# Patient Record
Sex: Female | Born: 2005 | Race: White | Hispanic: No | Marital: Single | Smoking: Never smoker
Health system: Southern US, Community
[De-identification: ages and names within clinical notes are randomized; demographics above are authoritative.]

## PROBLEM LIST (undated history)

## (undated) DIAGNOSIS — F32A Depression, unspecified: Secondary | ICD-10-CM

## (undated) DIAGNOSIS — F419 Anxiety disorder, unspecified: Secondary | ICD-10-CM

## (undated) HISTORY — DX: Depression, unspecified: F32.A

## (undated) HISTORY — PX: URETHRA SURGERY: SHX824

## (undated) HISTORY — DX: Anxiety disorder, unspecified: F41.9

---
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
## 2023-02-08 ENCOUNTER — Ambulatory Visit (INDEPENDENT_AMBULATORY_CARE_PROVIDER_SITE_OTHER): Payer: BC Managed Care – PPO | Admitting: Nurse Practitioner

## 2023-02-08 VITALS — BP 120/76 | HR 77 | Wt 153.0 lb

## 2023-02-08 DIAGNOSIS — Z3009 Encounter for other general counseling and advice on contraception: Secondary | ICD-10-CM

## 2023-02-08 DIAGNOSIS — Z30015 Encounter for initial prescription of vaginal ring hormonal contraceptive: Secondary | ICD-10-CM

## 2023-02-08 MED ORDER — ETONOGESTREL-ETHINYL ESTRADIOL 0.12-0.015 MG/24HR VA RING: 1.0000 | VAGINAL_RING | VAGINAL | 3 refills | Status: DC

## 2023-02-08 NOTE — Progress Notes (Signed)
   Acute Office Visit  Subjective:    Patient ID: Evelyn Perez, female    DOB: 06/21/2006, 17 y.o.   MRN: [id number]   HPI 17 y.o. presents today to discuss birth control. Nexplanon removed 11/2022 due to frequent bleeding. At that time she was not interested in contraception. Considering Nuvaring. Used Nuvaring with Nexplanon at one point for management of frequent bleeding and did well. Felt she gained weight with COCs. She was in [place] a couple of weeks ago and experienced severe left sided pain. Pain resolved but does feel pain can be intermittent, mainly with certain movements.   Patient's last menstrual period was 01/22/2023.    Review of Systems  Constitutional: Negative.   Gastrointestinal:  Positive for abdominal pain (LLQ, resolved).  Genitourinary: Negative.        Objective:    Physical Exam Constitutional:      Appearance: Normal appearance.   GU: Not indicated  BP 120/76   Pulse 77   Wt 153 lb (69.4 kg)   LMP 01/22/2023   SpO2 100%   BMI 23.96 kg/m  Wt Readings from Last 3 Encounters:  02/08/23 153 lb (69.4 kg) (83%, Z= 0.97)*  11/24/21 145 lb (65.8 kg) (80%, Z= 0.84)*  10/27/21 147 lb (66.7 kg) (82%, Z= 0.91)*   * Growth percentiles are based on CDC (Girls, 2-20 Years) data.        Assessment & Plan:   Problem List Items Addressed This Visit   None Visit Diagnoses     General counseling and advice on female contraception    -  Primary   Encounter for initial prescription of vaginal ring hormonal contraceptive       Relevant Medications   etonogestrel-ethinyl estradiol (NUVARING) 0.12-0.015 MG/24HR vaginal ring      Plan: Contraceptive options were reviewed, including hormonal methods, both combination (pill, patch, vaginal ring) and progesterone-only (pill, Depo Provera and Nexplanon), intrauterine devices (Mirena, [facility] [place], and [place]), Phexxi, barrier methods (condoms, diaphragm) and female/female sterilization. The mechanisms, risks,  benefits and side effects of all methods were discussed. Would like to start Nuvaring. Will start with first day of next period. LLQ pain has resolved but reports can be intermittent. Discussed possibility of ovarian cyst versus musculoskeletal. Will monitor. If returns can do pelvic ultrasound.      [name] DNP, 11:25 AM 02/08/2023

## 2023-02-23 ENCOUNTER — Other Ambulatory Visit: Payer: Self-pay | Admitting: Radiology

## 2023-02-23 DIAGNOSIS — B3731 Acute candidiasis of vulva and vagina: Secondary | ICD-10-CM

## 2023-02-23 NOTE — Telephone Encounter (Signed)
Medication refill request: terconazole 0.8% vaginal cream Last OV:  11-05-22 Next AEX: not scheduled Last MMG (if hormonal medication request): n/a Refill authorized: this rx denied, with the reason being for patient to contact the office

## 2023-04-19 NOTE — Telephone Encounter (Signed)
Note opened in error.

## 2023-09-09 ENCOUNTER — Ambulatory Visit: Payer: BC Managed Care – PPO | Admitting: Nurse Practitioner

## 2023-09-22 ENCOUNTER — Ambulatory Visit (INDEPENDENT_AMBULATORY_CARE_PROVIDER_SITE_OTHER): Payer: Self-pay | Admitting: Nurse Practitioner

## 2023-09-22 ENCOUNTER — Encounter: Payer: Self-pay | Admitting: Nurse Practitioner

## 2023-09-22 VITALS — BP 94/52 | HR 78 | Resp 14 | Ht 66.5 in | Wt 155.0 lb

## 2023-09-22 DIAGNOSIS — Z0000 Encounter for general adult medical examination without abnormal findings: Secondary | ICD-10-CM

## 2023-09-22 DIAGNOSIS — Z3009 Encounter for other general counseling and advice on contraception: Secondary | ICD-10-CM

## 2023-09-22 DIAGNOSIS — Z1331 Encounter for screening for depression: Secondary | ICD-10-CM | POA: Diagnosis not present

## 2023-09-22 DIAGNOSIS — Z01419 Encounter for gynecological examination (general) (routine) without abnormal findings: Secondary | ICD-10-CM | POA: Diagnosis not present

## 2023-09-22 NOTE — Progress Notes (Signed)
   Evelyn Perez [birth year] [id number]   History:  18 y.o. G0 presents for annual exam. No GYN complaints. Monthly cycles. Interested in getting Nexplanon again. Did Nuvaring but wants something she does not have to keep up with. Completed Gardasil series.   Gynecologic History Patient's last menstrual period was 09/14/2023. Period Cycle (Days): 28 Period Duration (Days): 5-6 Period Pattern: Regular Menstrual Flow: Heavy Menstrual Control: Maxi pad, Tampon Menstrual Control Change Freq (Hours): changes pad/tampon every 3 hours Dysmenorrhea: (!) Severe (back pain) Contraception/Family planning: coitus interruptus Sexually active: Yes  Health Maintenance Last Pap: Not indicated Last mammogram: Not indicated Last colonoscopy: Not indicated Last Dexa: Not indicated     09/22/2023    2:49 PM  Depression screen PHQ 2/9  Decreased Interest 0  Down, Depressed, Hopeless 0  PHQ - 2 Score 0     Past medical history, past surgical history, family history and social history were all reviewed and documented in the EPIC chart. At [facility]. Thinking psych or business. Boyfriend.   ROS:  A ROS was performed and pertinent positives and negatives are included.  Exam:  Vitals:   09/22/23 1444  BP: (!) 94/52  Pulse: 78  Resp: 14  Weight: 155 lb (70.3 kg)  Height: 5' 6.5" (1.689 m)   Body mass index is 24.64 kg/m.   General appearance:  Normal Thyroid:  Symmetrical, normal in size, without palpable masses or nodularity. Respiratory  Auscultation:  Clear without wheezing or rhonchi Cardiovascular  Auscultation:  Regular rate, without rubs, murmurs or gallops  Edema/varicosities:  Not grossly evident Abdominal  Soft,nontender, without masses, guarding or rebound.  Liver/spleen:  No organomegaly noted  Hernia:  None appreciated  Skin  Inspection:  Grossly normal Breasts: Not indicated per guidelines Pelvic: Not indicated  Assessment/Plan:  18 y.o. G0 for annual exam.   Well  female exam without gynecological exam - Education provided on SBEs, importance of preventative screenings, current guidelines, high calcium diet, regular exercise, and multivitamin daily.   General counseling and advice on female contraception - Plan: Insertion of implanon rod.  Return in about 1 year (around 09/21/2024) for Annual.   [name] DNP, 3:07 PM 09/22/2023

## 2023-10-18 ENCOUNTER — Encounter: Payer: Self-pay | Admitting: Nurse Practitioner

## 2023-10-18 ENCOUNTER — Ambulatory Visit: Admitting: Nurse Practitioner

## 2023-10-18 VITALS — BP 118/62 | HR 89 | Ht 68.0 in | Wt 154.0 lb

## 2023-10-18 DIAGNOSIS — Z30017 Encounter for initial prescription of implantable subdermal contraceptive: Secondary | ICD-10-CM | POA: Diagnosis not present

## 2023-10-18 DIAGNOSIS — Z01812 Encounter for preprocedural laboratory examination: Secondary | ICD-10-CM

## 2023-10-18 LAB — PREGNANCY, URINE: Preg Test, Ur: NEGATIVE

## 2023-10-18 MED ORDER — ETONOGESTREL 68 MG ~~LOC~~ IMPL
68.0000 mg | DRUG_IMPLANT | Freq: Once | SUBCUTANEOUS | Status: AC
Start: 2023-10-18 — End: 2023-10-18
  Administered 2023-10-18: 68 mg via SUBCUTANEOUS

## 2023-10-18 NOTE — Progress Notes (Signed)
 18 y.o. G0P0000 female presents for Nexplanon insertion.  She has been counseled about alternative types of contraception and has decided this long acting method is the best for her.  Procedure, risks and benefits have all been explained.  She has the following questions today:  None. Prefers right arm due to tattoos.   LMP:  10/14/2023   After all questions were answered, consent was obtained.    Past Medical History:  Diagnosis Date   Anxiety    Depression     Past Surgical History:  Procedure Laterality Date   URETHRA SURGERY      Current Outpatient Medications on File Prior to Visit  Medication Sig Dispense Refill   escitalopram (LEXAPRO) 20 MG tablet Take 20 mg by mouth daily.     ferrous sulfate 325 (65 FE) MG tablet Take by mouth.     No current facility-administered medications on file prior to visit.   Allergies  Allergen Reactions   Shellfish Allergy Shortness Of Breath   Latex Itching and Rash    Vitals:   10/18/23 0936  BP: 118/62  Pulse: 89  SpO2: 98%   Physical Exam  Procedure: Patient placed supine on exam table with her right arm flexed at the elbow. The location for insertion site was identified 8-10 cm from epicondyle and 3-5 cm posterior.  Area cleansed with Betadine x 3.  Insertion site and path of insertion was anesthetized with 1% Lidocaine without epinephrine, 1.5 cc total.  Using Nexplanon device (and after confirming presence of rod in device), skin was pierced and then elevated along insertion path, passing device just under the skin.  Rod released and device inserted.  Rod palpated easily.  Steri-strips applied and a pressure bandage was place over the site.  Entire procedure performed with sterile technique.  Assessment: Nexplanon insertion.  UPT neg  Plan:  Post procedure instructions reviewed with pt.  Questions answered.  Pt knows to call with any concerns or questions.  Pt is aware removal is due by 3 calendar years from today.

## 2024-01-10 ENCOUNTER — Ambulatory Visit
Admission: EM | Admit: 2024-01-10 | Discharge: 2024-01-10 | Disposition: A | Discharge status: Home / Self Care | Attending: Family Medicine | Admitting: Family Medicine

## 2024-01-10 DIAGNOSIS — R5383 Other fatigue: Secondary | ICD-10-CM | POA: Diagnosis not present

## 2024-01-10 LAB — POCT MONO SCREEN (KUC): Mono, POC: NEGATIVE

## 2024-01-10 NOTE — ED Triage Notes (Signed)
 Pt presents to [facility] with co fatigue, and weakness for 2 weeks. 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 Pt reports nausea body aches and pain since yesterday. Pt reports motrin  otc.

## 2024-01-10 NOTE — ED Provider Notes (Signed)
 Evelyn Perez    CSN: [id number] Arrival date & time: 01/10/24  1039      History   Chief Complaint Chief Complaint  Patient presents with   Fatigue    Concern for mono    HPI Evelyn Perez is a 18 y.o. female.   HPI 18 year old female presents with fatigue and weakness for 2 weeks.  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  Reports nausea, body aches and pains since yesterday.  PMH significant for ADHD and asthma, intermittent.  Epic chart reveals patient followed by oncology on 01/21/2023 for iron deficient anemia and chronic fatigue.  Past Medical History:  Diagnosis Date   Anxiety    Depression     Patient Active Problem List   Diagnosis Date Noted   Nondisplaced Salter-Harris type I physeal fracture of distal end of left radius 02/01/2019   Nondisplaced fracture of lateral malleolus of right fibula, initial encounter for closed fracture 05/26/2018   ADHD (attention deficit hyperactivity disorder), combined type 08/04/2017   Emotional disturbance of childhood 08/04/2017   Patellofemoral pain syndrome of right knee 02/24/2017   Asthma, intermittent 12/24/2015    Past Surgical History:  Procedure Laterality Date   URETHRA SURGERY      OB History     Gravida  0   Para  0   Term  0   Preterm  0   AB  0   Living  0      SAB  0   IAB  0   Ectopic  0   Multiple  0   Live Births  0            Home Medications    Prior to Admission medications   Medication Sig Start Date End Date Taking? Authorizing Provider  escitalopram (LEXAPRO) 20 MG tablet Take 20 mg by mouth daily.    [provider]  ferrous sulfate 325 (65 FE) MG tablet Take by mouth. 01/30/22 09/28/23  [provider]    Family History Family History  Problem Relation Age of Onset   Anxiety disorder Mother    Ovarian cancer Paternal Grandmother     Social History Social History   Tobacco Use   Smoking status:  Never    Passive exposure: Never   Smokeless tobacco: Never  Vaping Use   Vaping status: Never Used  Substance Use Topics   Alcohol use: Never   Drug use: Yes    Frequency: 3.0 times per week    Types: Marijuana     Allergies   Shellfish allergy and Latex   Review of Systems Review of Systems  Constitutional:  Positive for fatigue.  All other systems reviewed and are negative.    Physical Exam Triage Vital Signs ED Triage Vitals  Encounter Vitals Group     BP      Girls Systolic BP Percentile      Girls Diastolic BP Percentile      Boys Systolic BP Percentile      Boys Diastolic BP Percentile      Pulse      Resp      Temp      Temp src      SpO2      Weight      Height      Head Circumference      Peak Flow      Pain Score      Pain Loc  Pain Education      Exclude from Growth Chart    No data found.  Updated Vital Signs BP 114/73   Pulse 82   Temp 98.1 F (36.7 C)   Resp 19   LMP 12/21/2023 (Approximate)   SpO2 98%     Physical Exam Vitals and nursing note reviewed.  Constitutional:      Appearance: Normal appearance. She is normal weight.  HENT:     Head: Normocephalic and atraumatic.     Right Ear: Tympanic membrane, ear canal and external ear normal.     Left Ear: Tympanic membrane, ear canal and external ear normal.     Mouth/Throat:     Mouth: Mucous membranes are moist.     Pharynx: Oropharynx is clear.  Eyes:     Extraocular Movements: Extraocular movements intact.     Conjunctiva/sclera: Conjunctivae normal.     Pupils: Pupils are equal, round, and reactive to light.  Cardiovascular:     Rate and Rhythm: Normal rate and regular rhythm.     Pulses: Normal pulses.     Heart sounds: Normal heart sounds.  Pulmonary:     Effort: Pulmonary effort is normal.     Breath sounds: Normal breath sounds. No wheezing, rhonchi or rales.  Musculoskeletal:        General: Normal range of motion.     Cervical back: Normal range of motion  and neck supple.  Skin:    General: Skin is warm and dry.  Neurological:     General: No focal deficit present.     Mental Status: She is alert and oriented to person, place, and time. Mental status is at baseline.  Psychiatric:        Mood and Affect: Mood normal.        Behavior: Behavior normal.      UC Treatments / Results  Labs (all labs ordered are listed, but only abnormal results are displayed) Labs Reviewed  POCT MONO SCREEN [facility]) - Normal    EKG   Radiology No results found.  Procedures Procedures (including critical Perez time)  Medications Ordered in UC Medications - No data to display  Initial Impression / Assessment and Plan / UC Course  I have reviewed the triage vital signs and the nursing notes.  Pertinent labs & imaging results that were available during my Perez of the patient were reviewed by me and considered in my medical decision making (see chart for details).     MDM: 1.  Fatigue, unspecified type-patient has history of chronic fatigue and iron deficiency anemia.  Advised patient to follow-up with PCP or establish with new [place] primary Perez to have baseline labs drawn. Advised patient if symptoms worsen and/or unresolved please follow-up with your PCP or here for further evaluation.  [place] primary Perez contact information provided with his AVS today.  Work note provided to patient per request prior to discharge.  Patient discharged home, hemodynamically stable. Final Clinical Impressions(s) / UC Diagnoses   Final diagnoses:  Fatigue, unspecified type     Discharge Instructions      Advised patient if symptoms worsen and/or unresolved please follow-up with your PCP or here for further evaluation.  [place] primary Perez contact information provided with his AVS today.     ED Prescriptions   None    PDMP not reviewed this encounter.   Teddy Sharper, FNP 01/10/24 1135

## 2024-01-10 NOTE — Discharge Instructions (Addendum)
 Advised patient if symptoms worsen and/or unresolved please follow-up with your PCP or here for further evaluation.  Evelyn Perez primary care contact information provided with his AVS today.

## 2024-03-18 IMAGING — CT CT NECK W/ CM
4 of 5 series · 14 of 33 positions shown, 16 images · IV contrast (APPLIED)
Comparison: None.

CLINICAL DATA: Neck mass, cough for approximately 10 days, right
neck mass for 4 days

EXAM:
CT NECK WITH CONTRAST
TECHNIQUE: Multidetector CT imaging of the neck was performed using the
standard protocol following the bolus administration of intravenous
contrast.

[Series 2: axial neck (person_name) · axial · 0.58mm/px · z∈[-67,+35]mm · 3 of 103 slices shown]
[im 26/103  bone]
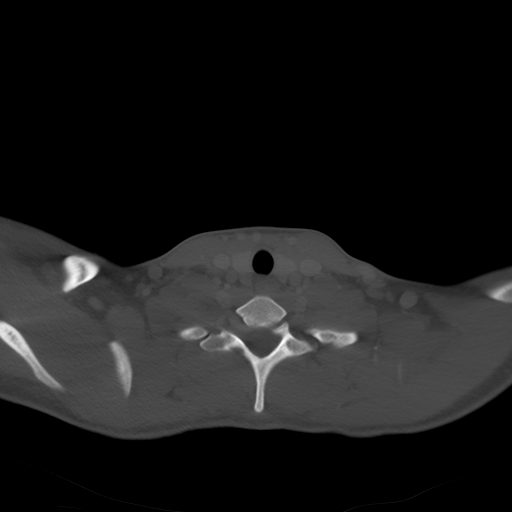
[im 52/103  bone]
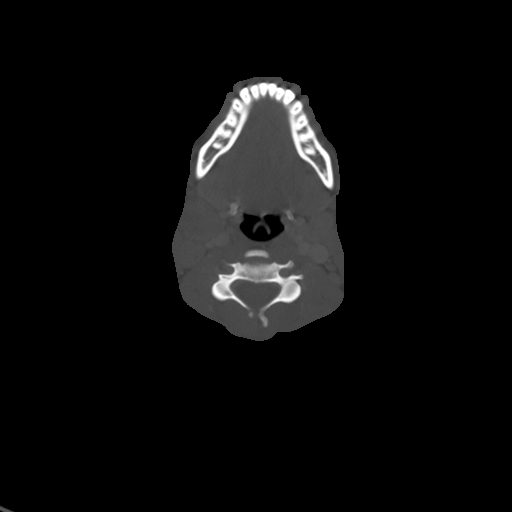
[im 77/103  bone]
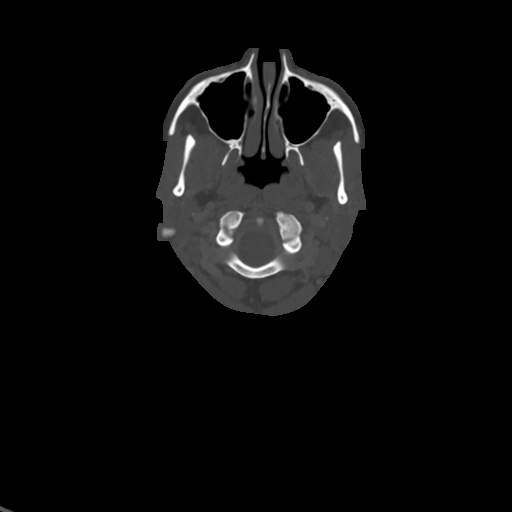

[Series 4: cor neck · coronal · 0.49mm/px · 3 of 131 slices shown]
[im 48/131  bone]
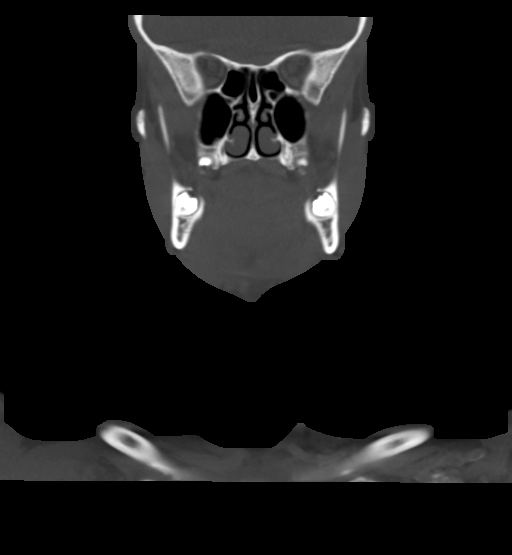
[im 60/131  bone]
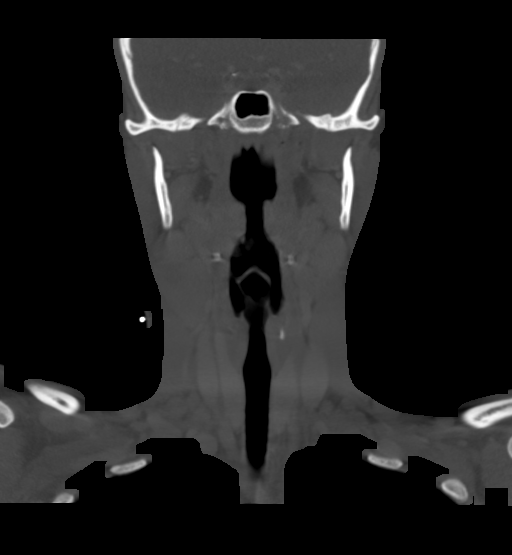
[im 72/131  bone]
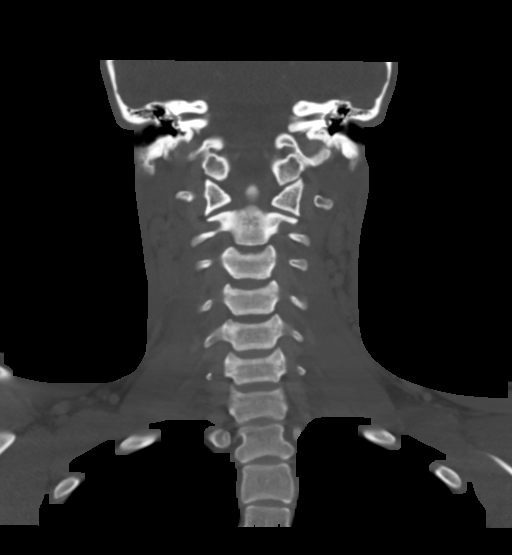

[Series 5: sag neck · sagittal · 0.51mm/px · 5 of 122 slices shown, 6 images]
[im 41/122  bone]
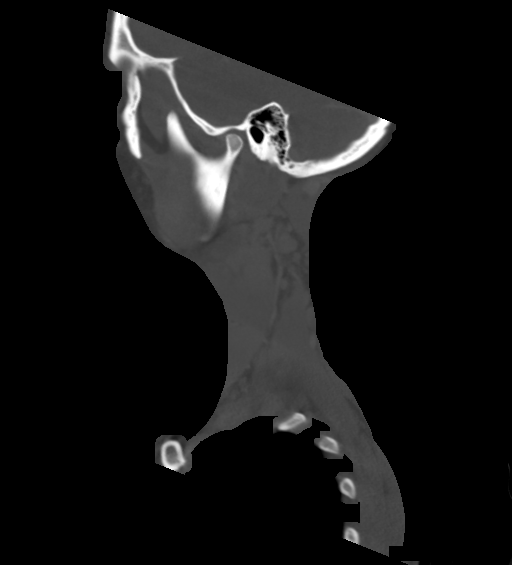
[im 51/122  bone]
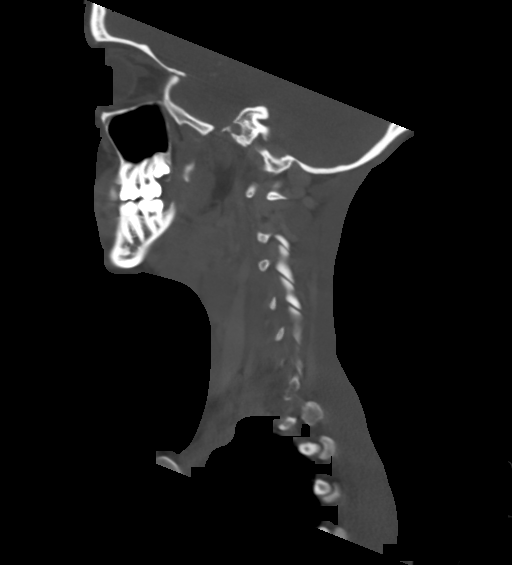
[im 61/122  soft-tissue]
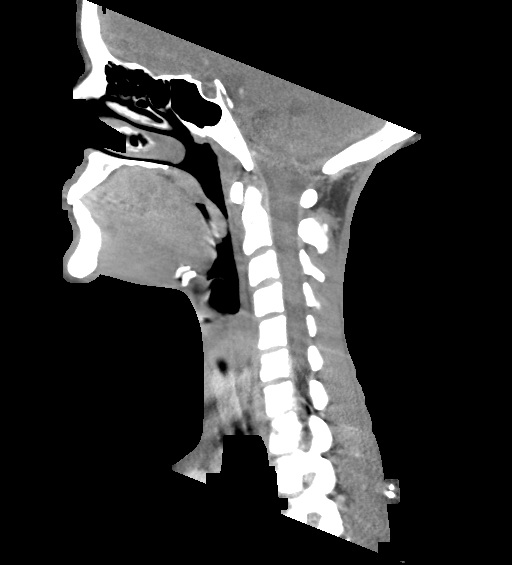
[im 61/122  bone]
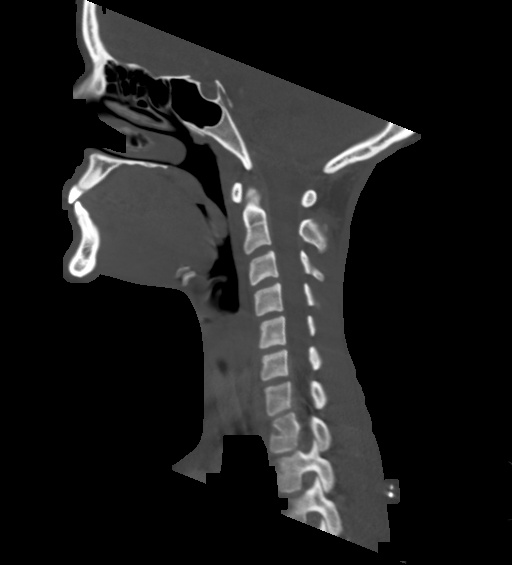
[im 71/122  bone]
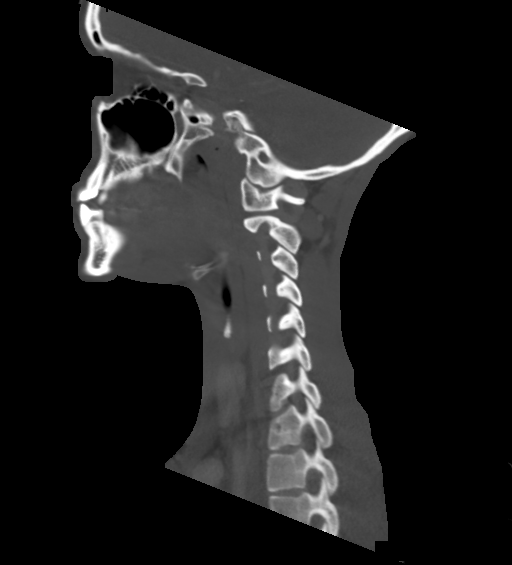
[im 81/122  bone]
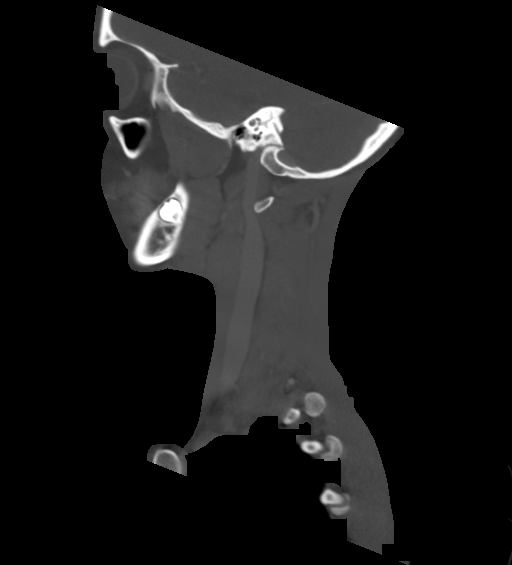

[Series 6: ax oropharynx · axial · 0.51mm/px · z∈[-132,-21]mm · 3 of 126 slices shown, 4 images]
[im 32/126  soft-tissue]
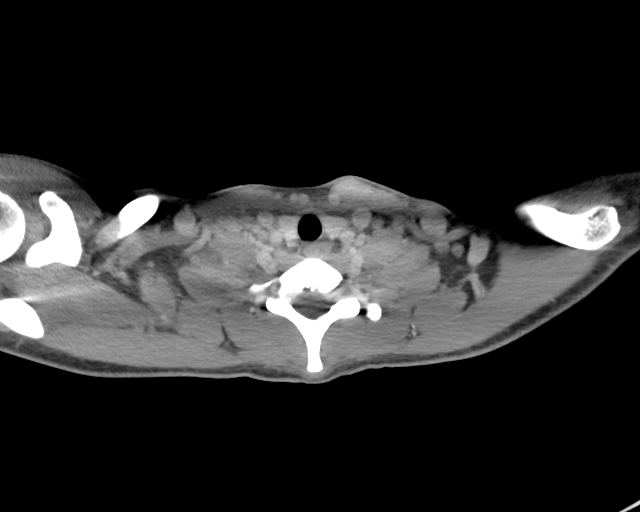
[im 32/126  bone]
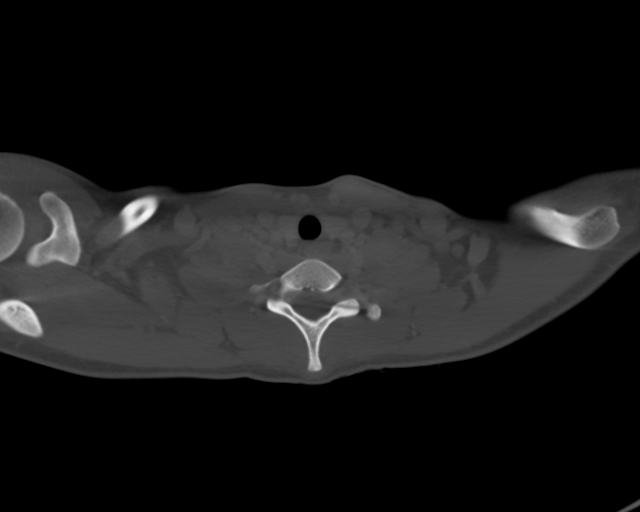
[im 63/126  bone]
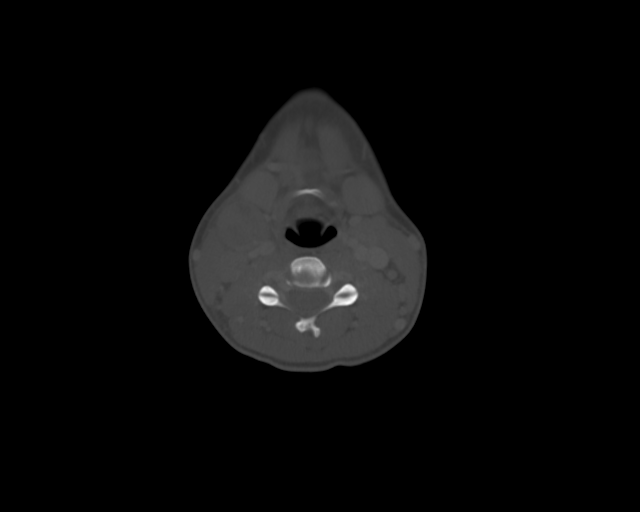
[im 94/126  bone]
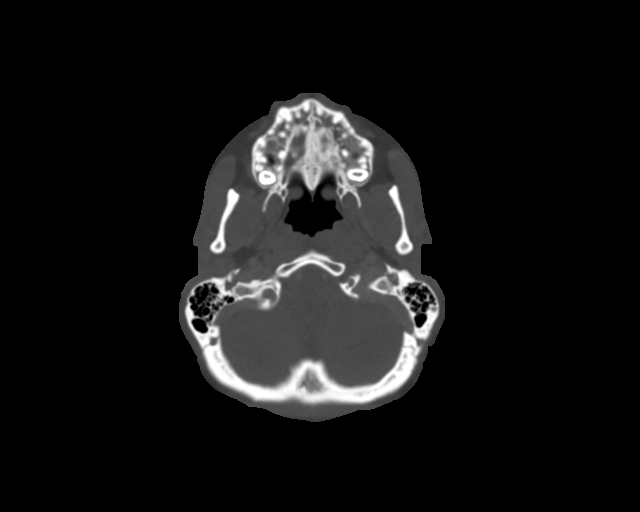

[14 of 33 positions shown; findings below may reference images not displayed]

RADIATION DOSE REDUCTION: This exam was performed according to the
departmental dose-optimization program which includes automated
exposure control, adjustment of the mA and/or kV according to
patient size and/or use of iterative reconstruction technique.

CONTRAST:  100mL OMNIPAQUE IOHEXOL 300 MG/ML  SOLN
FINDINGS: Pharynx and larynx: Enlargement of the bilateral palatine tonsils.
No focal low-density collection to suggest abscess or phlegmon. The
larynx and pharynx are otherwise unremarkable.

Salivary glands: No inflammation, mass, or stone.

Thyroid: Normal.

Lymph nodes: Significantly enlarged right level 2A lymph node or
lymph node conglomerate, with 2 large areas of central low density,
which measures up to 2.4 x 2.8 x 3.4 cm (series 2, image 53).
Enlarged right level 2B lymph node measures up to 1.6 cm in short
axis (series 2, image 50). Enlarged left level 2A lymph node
measures up to 1.6 cm in short axis (series 2, image 49). Numerous
prominent level 2, 3, and 4 lymph nodes, none of which demonstrate
abnormal density.

Vascular: Patent carotid and vertebral arteries. Jugular veins are
patent.

Limited intracranial: Negative.

Visualized orbits: Negative.

Mastoids and visualized paranasal sinuses: Clear.

Skeleton: No acute osseous abnormality.

Upper chest: Negative.

Other: None.
IMPRESSION: 1. Suppurative right level 2A lymph node or lymph node conglomerate.
No evidence of jugular venous thrombosis.
2. Enlargement of the palatine tonsils, most likely tonsillitis,
without evidence of peritonsillar abscess.
3. Additional enlarged level 2A and B lymph nodes, with prominent
level 3 and 4 lymph nodes, likely reactive.

## 2024-04-15 IMAGING — CT CT NECK W/ CM
4 of 5 series · 14 of 35 positions shown, 16 images · IV contrast (APPLIED)
Comparison: Neck CT 10/27/2021.

CLINICAL DATA: 16-year-old female with neck pain, productive cough.
Abnormal cervical lymph nodes, suspected suppurative right level 2A
node, and enlarged tonsils on neck CT last month.

EXAM:
CT NECK WITH CONTRAST
TECHNIQUE: Multidetector CT imaging of the neck was performed using the
standard protocol following the bolus administration of intravenous
contrast.

[Series 3: ax bone · axial · 0.54mm/px · z∈[+831,+951]mm · 3 of 121 slices shown, 4 images]
[im 31/121  soft-tissue]
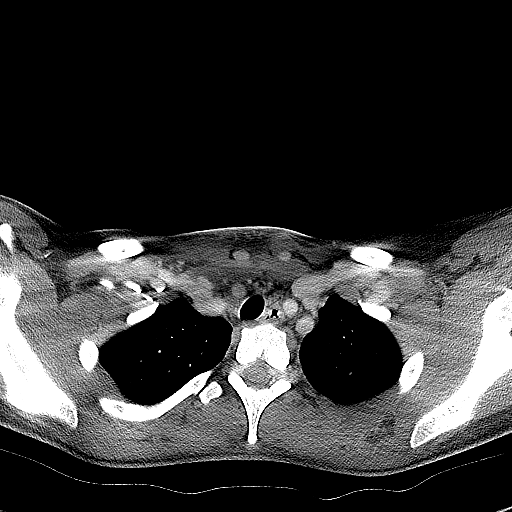
[im 31/121  bone]
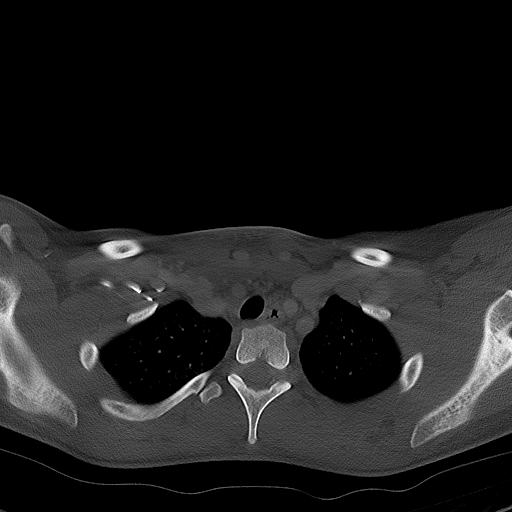
[im 61/121  bone]
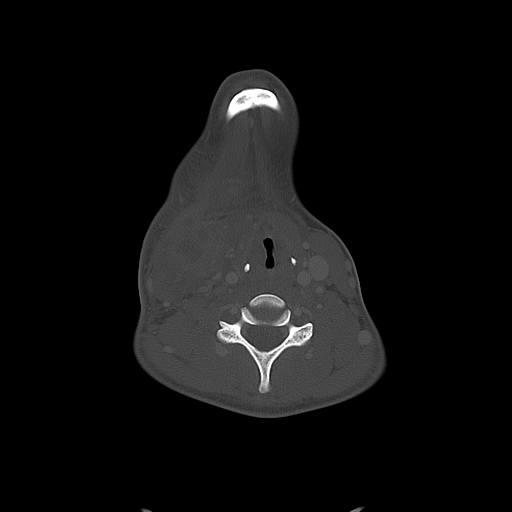
[im 91/121  bone]
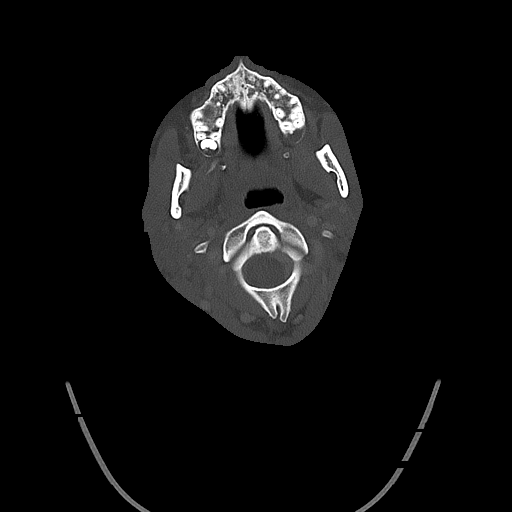

[Series 4: cor neck · coronal · 0.44mm/px · 3 of 133 slices shown]
[im 27/133  bone]
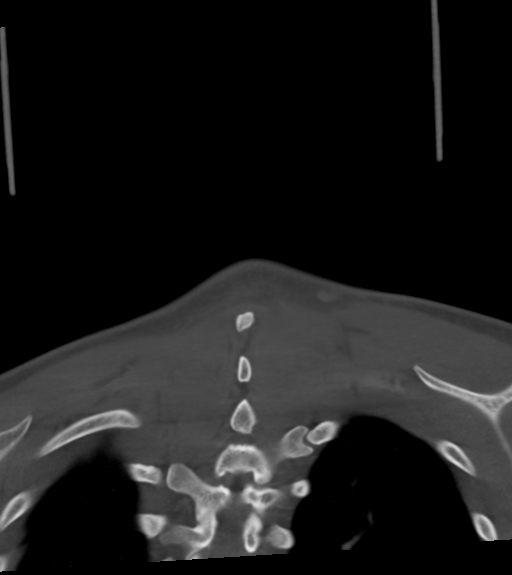
[im 53/133  bone]
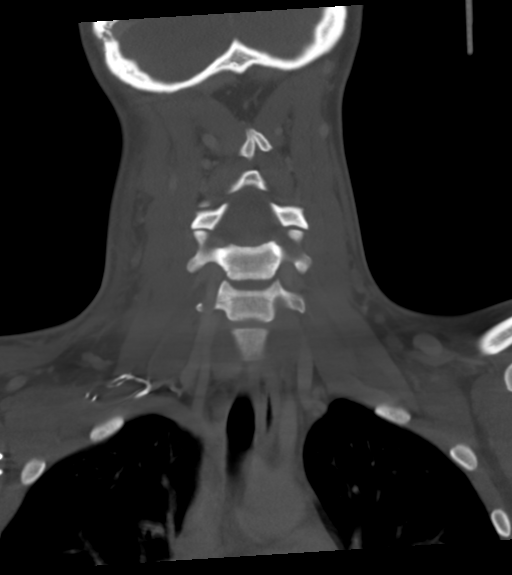
[im 80/133  bone]
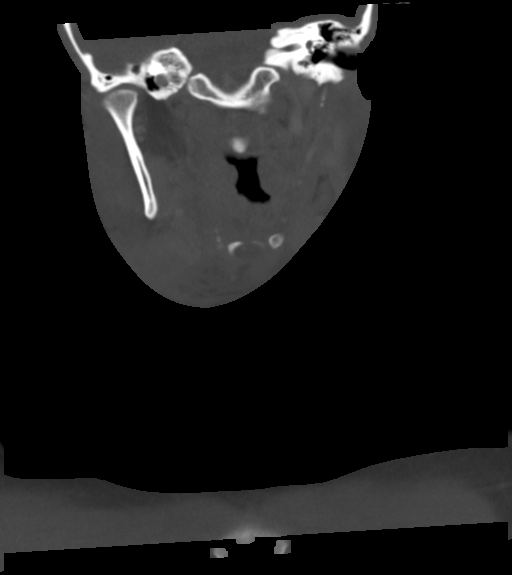

[Series 5: sag neck · sagittal · 0.50mm/px · 5 of 114 slices shown, 6 images]
[im 38/114  bone]
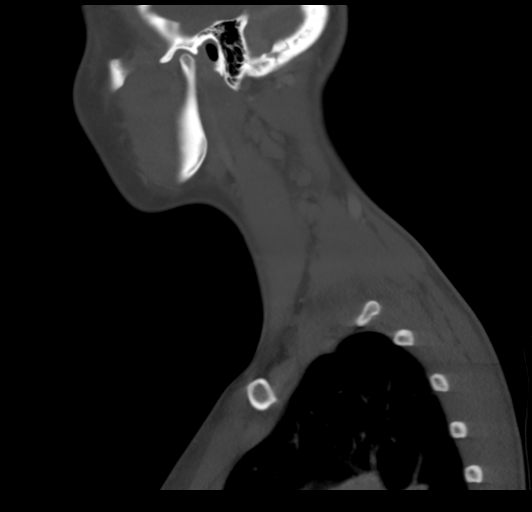
[im 48/114  bone]
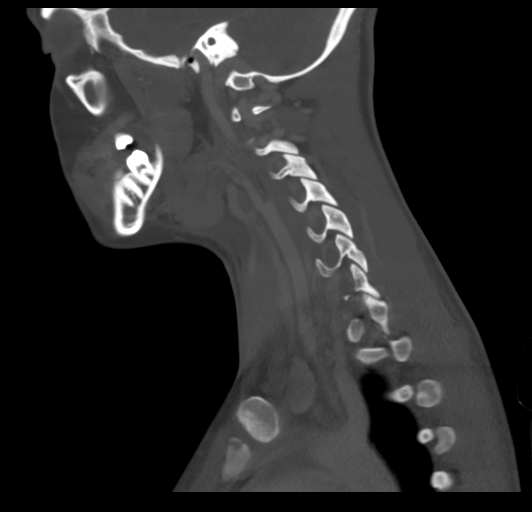
[im 57/114  soft-tissue]
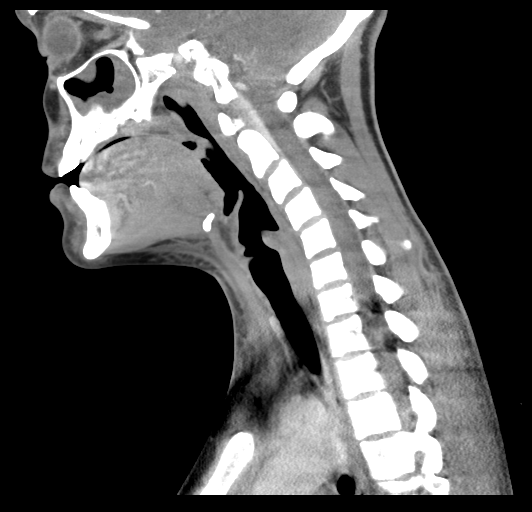
[im 57/114  bone]
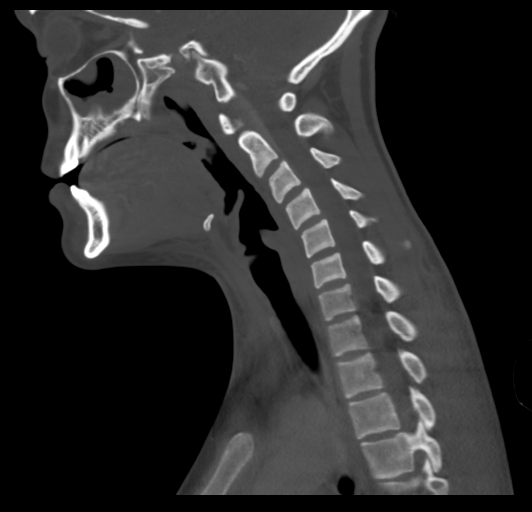
[im 66/114  bone]
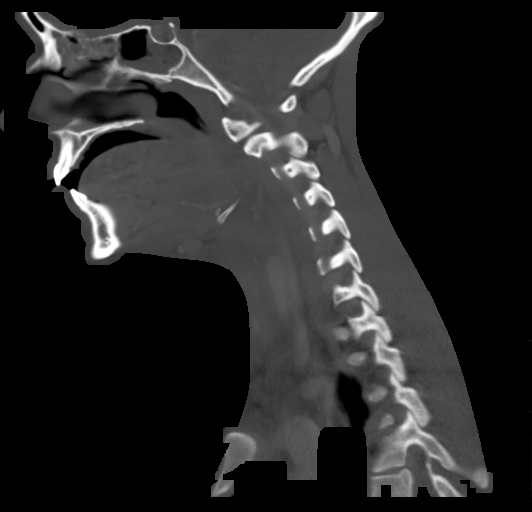
[im 76/114  bone]
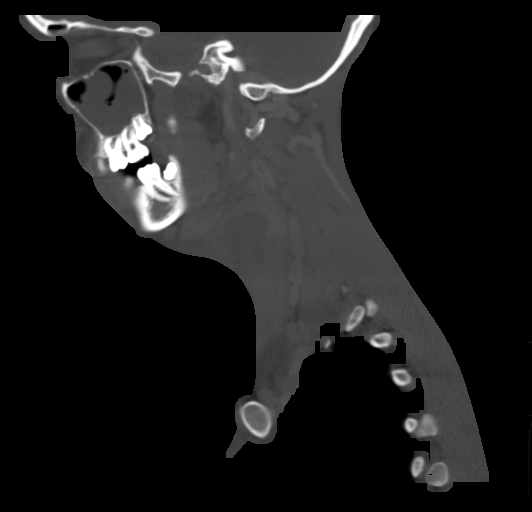

[Series 6: ax oropharynx · axial · 0.44mm/px · z∈[+832,+960]mm · 3 of 128 slices shown]
[im 32/128  bone]
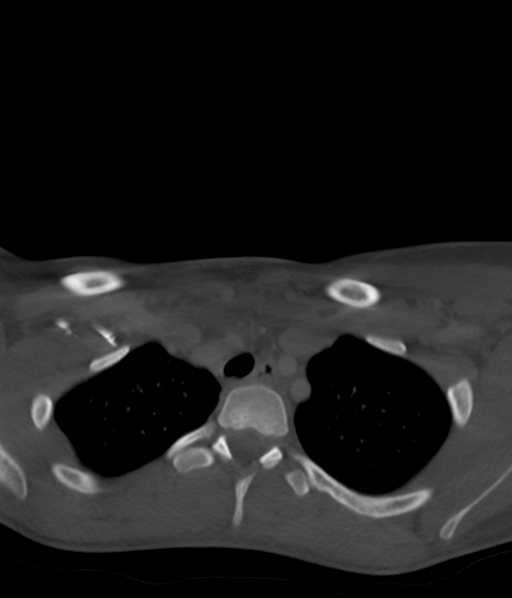
[im 64/128  bone]
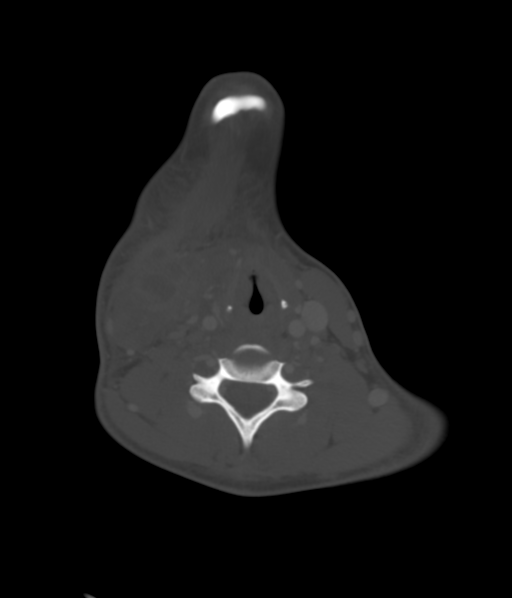
[im 96/128  bone]
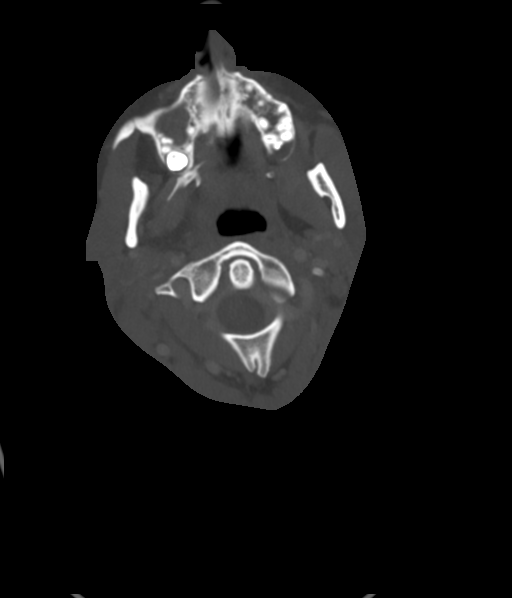

[14 of 35 positions shown; findings below may reference images not displayed]

RADIATION DOSE REDUCTION: This exam was performed according to the
departmental dose-optimization program which includes automated
exposure control, adjustment of the mA and/or kV according to
patient size and/or use of iterative reconstruction technique.

CONTRAST:  75mL OMNIPAQUE IOHEXOL 300 MG/ML  SOLN
FINDINGS: Pharynx and larynx: Laryngeal contours are within normal limits. No
significant tonsillar or adenoid hypertrophy today. Left
parapharyngeal space is normal.

But there is inflammation throughout much of the right
parapharyngeal space, and mild edema or effusion in the
retropharyngeal space which appears increased.

Salivary glands: Negative sublingual space. Mass effect on the right
submandibular space with secondary appearing inflamed right
submandibular gland now, regional soft tissue edema. Thickening of
the platysma.

Edema tracks cephalad into the posterior right masticator and right
parotid spaces also, although the right masticator muscles and
parotid parenchyma appear to remain normal.

Negative left submandibular and parotid spaces.

Thyroid: Negative.

Lymph nodes: Severe right cervical heterogeneous lymph node
enlargement, progressed abnormal right level 2 lymph node
abnormality since last month, with peripherally enhancing but
centrally cystic or necrotic node now measuring nearly 4 cm long
axis, and lobulated internal complex fluid component 28 x 35 by 31
mm (AP by transverse by CC), estimated fluid volume 15 mL.
Progressed, moderate to severe regional soft tissue inflammation and
stranding including edema throughout the regional subcutaneous fat.
And multiple additional asymmetrically enlarged and hyperenhancing
right side lymph nodes from level 1 through level 4.

Contralateral left side cervical lymph nodes appear stable to mildly
improved, now up to 13 mm short axis at the left level 2A. No
left-side cystic or necrotic nodes.

Vascular: The right internal jugular vein is completely effaced by
the right neck lymph node abnormality, but remains patent both above
and below that level. Other major vascular structures in the neck
and at the skull base are enhancing and patent including the right
sigmoid venous sinus.

Limited intracranial: Negative.

Visualized orbits: Negative.

Mastoids and visualized paranasal sinuses: New diffuse paranasal
sinus mucosal thickening and enhancement with evidence of bilateral
sinus fluid. Tympanic cavities and mastoids remain clear.

Skeleton: No acute dental finding identified. No osseous abnormality
identified. There is abnormal reversal of cervical spine lordosis.

Upper chest: Visible superior mediastinum remains within normal
limits. Upper lungs are clear. Visible axillary lymph nodes are
within normal limits.
IMPRESSION: 1. Severe Suppurative Right Neck Lymphadenitis.
Bulky Ex-nodal Abscess now at the right level 2A station estimated
at 15 mL (coronal image 70).
Severe regional inflammation, secondarily affecting the
submandibular space among others.
Abundant other reactive appearing right neck lymph nodes.
Effaced but patent right internal jugular vein.
Recommend ENT consultation.

2. New generalized Acute Paranasal Sinusitis.

3. But tonsillar tissue and left neck lymph nodes appear improved
since last month.

## 2024-05-30 ENCOUNTER — Ambulatory Visit (INDEPENDENT_AMBULATORY_CARE_PROVIDER_SITE_OTHER): Payer: Self-pay | Admitting: Emergency Medicine

## 2024-05-30 ENCOUNTER — Encounter: Payer: Self-pay | Admitting: Emergency Medicine

## 2024-05-30 VITALS — BP 112/74 | HR 95 | Ht 69.0 in | Wt 143.0 lb

## 2024-05-30 DIAGNOSIS — F411 Generalized anxiety disorder: Secondary | ICD-10-CM | POA: Diagnosis not present

## 2024-05-30 MED ORDER — SERTRALINE HCL 25 MG PO TABS: 25.0000 mg | ORAL_TABLET | Freq: Every day | ORAL | 0 refills | Status: DC

## 2024-05-30 NOTE — Progress Notes (Signed)
 [facility] MD/PA/NP Initial Note  05/30/2024 2:39 PM Evelyn Perez  MRN:  [id number]  Chief Complaint:  Chief Complaint   Establish Care; Anxiety     HPI:   Mrs. Chaunte is a 18 yo F presenting to clinic for new patient psychiatric evaluation with a 22-month history of gradual worsening anxiety.  Current Psych Medication Regimen: None  She reports increased anxiety tied to situational stressors including going back to college in [place], difficult relationship with her sister, and dysfunctional family dynamic. She experience heightened anxiety around communicating with her sister and meeting her father's expectations (dad prefers her to go to [employer]), along with worry about returning to college interacting with classmates and class schedule. Physical symptoms include chest tightness and overwhelming sensation in her body. Her anxiety symptoms are poorly controlled, characterized by constant worry and restlessness without panic attacks. She denies depressive symptoms. Energy, motivation, and cognitive function including concentration decision-making and memory are intact. She reports no feelings of excessive guilt or worthlessness. Sleep is adequate and restful. Appetite is stable, with normal weight. Intact ADLs and personal hygiene. Ongoing symptom monitoring continues.   She is not currently engaged in therapy due to recent relocation (college at [employer]) and therapist retirement but is seeking a new therapist (will be finding  one in [place] due to in person preference). 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 2025) abstinence period during which she noticed decreased paranoia and slight decrease in anxiety.  Denies mania, delirium, AVH, SI, HI, or self-harm behaviors. No further complaints at this time.   Visit Diagnosis:    ICD-10-CM   1. GAD (generalized anxiety disorder)  F41.1       Past Psychiatric History:  No prior hospitalization  Past Psych Medication Trials: Discontinued Lexapro 20 mg po daily in 2023 - emotional bluntness  Past Medical History:  Past Medical History:  Diagnosis Date   Anxiety    Depression     Past Surgical History:  Procedure Laterality Date   URETHRA SURGERY     Family Psychiatric History:  M - depression/anxiety Grandfather - alcoholism  No FH of suicide, bipolar or schizophrenia   Family History:  Family History  Problem Relation Age of Onset   Anxiety disorder Mother    Ovarian cancer Paternal Grandmother    Social History: Patient lives parents, but will be relocating in the next few months to [place] [place] to attend [employer]). 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 Reports supportive social support system. Denies tobacco, and alcohol. Endorses marijuana use. Engages in leisure activities such as basketball, hiking, spending time with nature. No history of legal issues or domestic violence reported. Financial and housing stability are stable.  Social History   Socioeconomic History   Marital status: Single    Spouse name: Not on file   Number of children: Not on file   Years of education: Not on file   Highest education level: Not on file  Occupational History   Not on file  Tobacco Use   Smoking status: Never    Passive exposure: Never   Smokeless tobacco: Never  Vaping Use   Vaping status: Never Used  Substance and Sexual Activity   Alcohol use: Never   Drug use: Yes    Frequency: 3.0 times per week    Types: Marijuana   Sexual activity: Yes    Birth control/protection: None  Other Topics Concern   Not on file  Social History Narrative   ** Merged History Encounter **       Social Drivers of Health   Financial Resource Strain: Low Risk  (05/30/2024)   Overall Financial Resource Strain (CARDIA)    Difficulty of Paying Living Expenses: Not hard at all  Food Insecurity: Low  Risk  (09/20/2022)   Received from [facility]   Hunger Vital Sign    Within the past 12 months, you worried that your food would run out before you got money to buy more: Never true    Within the past 12 months, the food you bought just didn't last and you didn't have money to get more. : Never true  Transportation Needs: No Transportation Needs (09/20/2022)   Received from [facility]    In the past 12 months, has lack of reliable transportation kept you from medical appointments, meetings, work or from getting things needed for daily living? : No  Physical Activity: Not on file  Stress: Stress Concern Present (05/30/2024)   [employer] of Occupational Health - Occupational Stress Questionnaire    Feeling of Stress: To some extent  Social Connections: Not on file    Allergies:  Allergies  Allergen Reactions   Shellfish Allergy Shortness Of Breath   Latex Itching and Rash    Metabolic Disorder Labs: No results found for: HGBA1C, MPG No results found for: PROLACTIN No results found for: CHOL, TRIG, HDL, CHOLHDL, VLDL, LDLCALC Lab Results  Component Value Date   TSH 1.58 01/10/2021    Therapeutic Level Labs: No results found for: LITHIUM No results found for: VALPROATE No results found for: CBMZ  Current Medications: Current Outpatient Medications  Medication Sig Dispense Refill   ferrous sulfate 325 (65 FE) MG tablet Take by mouth.     No current facility-administered medications for this visit.    Medication Side Effects: none  Orders placed this visit:  No orders of the defined types were placed in this encounter.  Psychiatric Specialty Exam:  Review of Systems  Constitutional:  Positive for fatigue.  Cardiovascular:  Positive for chest pain.  Neurological:  Positive for headaches.    Blood pressure 112/74, pulse 95, height 5' 9 (1.753 m), weight 143 lb (64.9 kg).Body mass index is 21.12 kg/m.  General  Appearance: Neat and Well Groomed  Eye Contact:  Good  Speech:  Normal Rate  Volume:  Normal  Mood:  Anxious  Affect:  Appropriate  Thought Process:  Coherent, Goal Directed, and Linear  Orientation:  Full (Time, Place, and Person)  Thought Content: WDL   Suicidal Thoughts:  No  Homicidal Thoughts:  No  Memory:  WNL  Judgement:  Good  Insight:  Good  Psychomotor Activity:  Normal  Concentration:  Concentration: Good  Recall:  Good  Fund of Knowledge: Good  Language: Good  Assets:  Communication Skills Desire for Improvement Financial Resources/Insurance Social Support Transportation Vocational/Educational  ADL's:  Intact  Cognition: WNL  Prognosis:  Good   Screenings:  PHQ2-9    Flowsheet Row Office Visit from [facility]22/20[facility] in [facility] [place] [facility] from 05/07/2020 in [facility]  - A Dept Of [place]  PHQ-2 Total Score 0 0   Flowsheet Row UC from 01/10/2024 in [facility] ED from 11/24/2021 in [facility] at [facility] ED from 10/27/2021 in [facility]  at [facility]  C-SSRS RISK CATEGORY No Risk No Risk No Risk    Receiving Psychotherapy: No   Treatment Plan/Recommendations:   I provided approximately 60 minutes of face to face time during this encounter, including time spent before and after the visit in records review, medical decision making, counseling pertinent to today's visit, and charting.   Discussed dx and tx plan. Discussed alternative options including therapy.  PDMP reviewed: low risk trend   GAD - not controlled Initiate Zoloft  25 mg po daily  Will increase to 50 mg po daily, if tolerated or symptoms persist   Advised to keep a medication side effect journal to systematically track any symptoms, noting onset, frequency, severity, and context of side effects. Psychotherapy was strongly recommended as important  adjunct treatment to medication - will be looking for therapist in [place]  FU: 4-6 weeks or sooner if clinically indicated.  Risks, benefits, and alternatives of the medications and treatment plan prescribed today were discussed. Instructed patient to contact office or go to ED if experiencing any significant tolerability issues. Patient engaged in shared decision-making;treatment plan reviewed and agreed upon.     Keaton Beichner, PA-C

## 2024-06-22 ENCOUNTER — Other Ambulatory Visit: Payer: Self-pay | Admitting: Emergency Medicine

## 2024-06-22 DIAGNOSIS — F411 Generalized anxiety disorder: Secondary | ICD-10-CM

## 2024-06-26 ENCOUNTER — Ambulatory Visit: Payer: Self-pay | Admitting: Emergency Medicine

## 2024-07-10 ENCOUNTER — Encounter: Payer: Self-pay | Admitting: Emergency Medicine

## 2024-07-10 ENCOUNTER — Ambulatory Visit: Payer: Self-pay | Admitting: Emergency Medicine

## 2024-07-10 DIAGNOSIS — F411 Generalized anxiety disorder: Secondary | ICD-10-CM | POA: Diagnosis not present

## 2024-07-10 MED ORDER — SERTRALINE HCL 25 MG PO TABS: 50.0000 mg | ORAL_TABLET | Freq: Every day | ORAL | 0 refills | Status: AC

## 2024-07-10 NOTE — Progress Notes (Signed)
 Evelyn Perez [id number] [birth year] 19 y.o.  Subjective:   Patient ID:  Evelyn Perez is a 19 y.o. (DOB Dec 04, 2005) female.  Chief Complaint:  Chief Complaint  Patient presents with   Follow-up   Anxiety    HPI Evelyn Perez presents to the office today for follow-up for routine medication management.   Current Psych Medication Regimen: No SE reported. Zoloft  25 mg po daily  Pt is doing better since LOV.    Anxiety symptoms are better controlled with decreased worry and restlessness; no panic attacks reported. Evelyn Perez reports that Evelyn Perez is able to better emotionally regulate during situational stressors including difficult relationship with her sister, returning to college in the next few days, and overcoming a 6 year break up. Evelyn Perez reports using breathing techniques, grounding, walking away to calm down and better thought processing when triggered. Evelyn Perez no longer experiences physical symptoms including chest tightness and overwhelming sensation in her body. However, Evelyn Perez is requesting dosage increase because Evelyn Perez is still anxious about going back to college in [place] away from family, starting new classes, and starting job with new boss. Evelyn Perez will be taking her pet dog for emotional support.   Evelyn Perez denies depressive symptoms. Energy, motivation, and cognitive function include concentration decision-making and memory are intact. Evelyn Perez reports no feelings of excessive guilt or worthlessness. Sleep is adequate and restful. Appetite is stable, with normal weight. Intact ADLs and personal hygiene. Ongoing symptom monitoring continues.   Not currently engaged in therapy but in the process of looking for therapist in [place]. Smokes marijuana occasionally.   Denies mania, delirium, AVH, SI, HI or self-harm behaviors. No further complaints at this time.   GAD-7    Flowsheet Row Office Visit from 05/30/2024 in [facility]  Total GAD-7 Score 12   PHQ2-9     Flowsheet Row Office Visit from 09/22/2023 in [facility] from 05/07/2020 in [facility]  - A Dept Of [place]. [facility]  PHQ-2 Total Score 0 0   Flowsheet Row UC from 01/10/2024 in [facility] ED from 11/24/2021 in [facility] Department at [facility] ED from 10/27/2021 in [facility] Department at [facility]  C-SSRS RISK CATEGORY No Risk No Risk No Risk     Review of Systems:  Review of Systems  Psychiatric/Behavioral:         Please refer to HPI.  All other systems reviewed and are negative.   Past medications for mental health diagnoses include: Discontinued Lexapro 20 mg po daily in 2023 - emotional bluntness  Medications: I have reviewed the patient's current medications.  Current Outpatient Medications  Medication Sig Dispense Refill   ferrous sulfate 325 (65 FE) MG tablet Take by mouth.     sertraline  (ZOLOFT ) 25 MG tablet Take 1 tablet (25 mg total) by mouth daily. 30 tablet 0   No current facility-administered medications for this visit.    Medication Side Effects: None  Allergies: Allergies[1]  Past Medical History:  Diagnosis Date   Anxiety    Depression     Past Medical History, Surgical history, Social history, and Family history were reviewed and updated as appropriate.   Please see review of systems for further details on the patient's review from today.   Objective:   Physical Exam:  There were no vitals taken for this visit.  Physical Exam Constitutional:      Appearance: Normal appearance. Evelyn Perez is  normal weight.  Neurological:     General: No focal deficit present.     Mental Status: Evelyn Perez is alert and oriented to person, place, and time.  Psychiatric:        Attention and Perception: Attention and perception normal.        Mood and Affect: Mood and affect normal.        Speech: Speech normal.        Behavior:  Behavior normal. Behavior is cooperative.        Thought Content: Thought content normal.        Cognition and Memory: Cognition and memory normal.        Judgment: Judgment normal.     Lab Review:     Component Value Date/Time   NA 137 11/24/2021 0819   K 3.5 11/24/2021 0819   CL 102 11/24/2021 0819   CO2 24 11/24/2021 0819   GLUCOSE 93 11/24/2021 0819   BUN 7 11/24/2021 0819   CREATININE 0.75 11/24/2021 0819   CALCIUM 9.3 11/24/2021 0819   PROT 7.2 11/24/2021 0819   ALBUMIN 3.9 11/24/2021 0819   AST 10 (L) 11/24/2021 0819   ALT 9 11/24/2021 0819   ALKPHOS 60 11/24/2021 0819   BILITOT 0.8 11/24/2021 0819   GFRNONAA >60 11/24/2021 0819       Component Value Date/Time   WBC 23.8 (H) 11/24/2021 0819   RBC 5.15 (H) 11/24/2021 0819   HGB 12.4 11/24/2021 0819   HCT 39.3 11/24/2021 0819   PLT 279 11/24/2021 0819   MCV 76.3 (L) 11/24/2021 0819   MCH 24.1 (L) 11/24/2021 0819   MCHC 31.6 11/24/2021 0819   RDW 15.6 (H) 11/24/2021 0819   LYMPHSABS 1.9 11/24/2021 0819   MONOABS 1.7 (H) 11/24/2021 0819   EOSABS 0.1 11/24/2021 0819   BASOSABS 0.1 11/24/2021 0819    No results found for: POCLITH, LITHIUM   No results found for: PHENYTOIN, PHENOBARB, VALPROATE, CBMZ   .res Assessment: Plan:    Evelyn Perez was seen today for follow-up and anxiety.  Diagnoses and all orders for this visit:  GAD (generalized anxiety disorder)     Please see After Visit Summary for patient specific instructions.  Future Appointments  Date Time Provider Department Center  07/10/2024  1:30 PM Florina Friends, PA-C [facility] None    No orders of the defined types were placed in this encounter.  I provided approximately 30 minutes of face to face time during this encounter, including time spent before and after the visit in records review, medical decision making, counseling pertinent to today's visit, and charting.   Discussed dx and tx plan. Discussed alternative options  including therapy.   PDMP reviewed: low risk trend   GAD - not controlled, but better managed Increase Zoloft  to 50 mg po daily Refilled 90 day supply Will increase to 100 mg if tolerated or symptoms persist   Advised to keep a medication side effect journal to systematically track any symptoms, noting onset, frequency, severity, and context of side effects. Psychotherapy was strongly recommended as important adjunct treatment to medication - pt is in process of looking for therapist in [place]  Spent approximately 20 minutes providing education and counseling on therapeutic lifestyle modifications. Emphasizing the importance of regular exercise of at least 3x a week for 30 min, incorporating healthier diet options with a focus on balanced nutrition and reduced processed foods, and considering appropriate supplementation including multi vitamin, vitamin d, b 12, magnesium glycinate, iron to support overall well-being.  FOLLOW UP: 3 months (virtual) - pt will be in college in [place] or sooner if clinically indicated  Risks, benefits, and alternatives of the medications and treatment plan prescribed today were discussed. Patient engaged in shared decision-making;treatment plan reviewed and agreed upon.    Evelyn Toomey PA-C, DMSc     [1]  Allergies Allergen Reactions   Shellfish Allergy Shortness Of Breath   Latex Itching and Rash

## 2024-10-09 ENCOUNTER — Telehealth: Admitting: Emergency Medicine
